# Patient Record
Sex: Female | Born: 1960 | Race: White | Hispanic: No | Marital: Married | Smoking: Never smoker
Health system: Southern US, Community
[De-identification: ages and names within clinical notes are randomized; demographics above are authoritative.]

## PROBLEM LIST (undated history)

## (undated) DIAGNOSIS — J45909 Unspecified asthma, uncomplicated: Secondary | ICD-10-CM

## (undated) HISTORY — PX: TONSILLECTOMY: SUR1361

---

## 1998-12-04 ENCOUNTER — Other Ambulatory Visit: Admission: RE | Admit: 1998-12-04 | Discharge: 1998-12-04 | Payer: Self-pay | Admitting: Obstetrics

## 2000-01-07 ENCOUNTER — Other Ambulatory Visit: Admission: RE | Admit: 2000-01-07 | Discharge: 2000-01-07 | Payer: Self-pay | Admitting: *Deleted

## 2000-05-28 ENCOUNTER — Encounter: Payer: Self-pay | Admitting: Family Medicine

## 2000-05-28 ENCOUNTER — Ambulatory Visit (HOSPITAL_COMMUNITY): Admission: RE | Admit: 2000-05-28 | Discharge: 2000-05-28 | Payer: Self-pay | Admitting: Family Medicine

## 2001-10-12 ENCOUNTER — Other Ambulatory Visit: Admission: RE | Admit: 2001-10-12 | Discharge: 2001-10-12 | Payer: Self-pay | Admitting: Obstetrics and Gynecology

## 2001-10-25 ENCOUNTER — Encounter: Admission: RE | Admit: 2001-10-25 | Discharge: 2001-10-25 | Payer: Self-pay | Admitting: Obstetrics and Gynecology

## 2001-10-25 ENCOUNTER — Encounter: Payer: Self-pay | Admitting: Obstetrics and Gynecology

## 2002-11-08 ENCOUNTER — Other Ambulatory Visit: Admission: RE | Admit: 2002-11-08 | Discharge: 2002-11-08 | Payer: Self-pay | Admitting: Obstetrics and Gynecology

## 2004-08-12 ENCOUNTER — Emergency Department (HOSPITAL_COMMUNITY): Admission: AD | Admit: 2004-08-12 | Discharge: 2004-08-12 | Payer: Self-pay | Admitting: Family Medicine

## 2004-08-14 ENCOUNTER — Emergency Department (HOSPITAL_COMMUNITY): Admission: EM | Admit: 2004-08-14 | Discharge: 2004-08-14 | Payer: Self-pay | Admitting: Family Medicine

## 2005-07-11 ENCOUNTER — Encounter: Admission: RE | Admit: 2005-07-11 | Discharge: 2005-07-11 | Payer: Self-pay | Admitting: Obstetrics and Gynecology

## 2006-11-12 ENCOUNTER — Ambulatory Visit: Payer: Self-pay | Admitting: Psychology

## 2006-11-27 ENCOUNTER — Ambulatory Visit: Payer: Self-pay | Admitting: Psychology

## 2006-12-09 ENCOUNTER — Ambulatory Visit: Payer: Self-pay | Admitting: Psychology

## 2006-12-18 ENCOUNTER — Ambulatory Visit: Payer: Self-pay | Admitting: Psychology

## 2007-01-22 ENCOUNTER — Ambulatory Visit: Payer: Self-pay | Admitting: Psychology

## 2007-02-05 ENCOUNTER — Ambulatory Visit: Payer: Self-pay | Admitting: Psychology

## 2007-02-19 ENCOUNTER — Ambulatory Visit: Payer: Self-pay | Admitting: Psychology

## 2007-03-05 ENCOUNTER — Ambulatory Visit: Payer: Self-pay | Admitting: Psychology

## 2010-12-20 ENCOUNTER — Other Ambulatory Visit: Payer: Self-pay | Admitting: Obstetrics and Gynecology

## 2010-12-20 DIAGNOSIS — Z1231 Encounter for screening mammogram for malignant neoplasm of breast: Secondary | ICD-10-CM

## 2011-01-01 ENCOUNTER — Ambulatory Visit
Admission: RE | Admit: 2011-01-01 | Discharge: 2011-01-01 | Disposition: A | Discharge status: Home / Self Care | Payer: Commercial Managed Care - PPO | Source: Ambulatory Visit | Attending: Obstetrics and Gynecology | Admitting: Obstetrics and Gynecology

## 2011-01-01 DIAGNOSIS — Z1231 Encounter for screening mammogram for malignant neoplasm of breast: Secondary | ICD-10-CM

## 2011-01-02 ENCOUNTER — Other Ambulatory Visit: Payer: Self-pay | Admitting: Obstetrics and Gynecology

## 2011-01-02 DIAGNOSIS — R928 Other abnormal and inconclusive findings on diagnostic imaging of breast: Secondary | ICD-10-CM

## 2011-01-09 ENCOUNTER — Ambulatory Visit
Admission: RE | Admit: 2011-01-09 | Discharge: 2011-01-09 | Disposition: A | Discharge status: Home / Self Care | Payer: Commercial Managed Care - PPO | Source: Ambulatory Visit | Attending: Obstetrics and Gynecology | Admitting: Obstetrics and Gynecology

## 2011-01-09 ENCOUNTER — Other Ambulatory Visit: Payer: Self-pay | Admitting: Obstetrics and Gynecology

## 2011-01-09 DIAGNOSIS — R928 Other abnormal and inconclusive findings on diagnostic imaging of breast: Secondary | ICD-10-CM

## 2011-05-27 ENCOUNTER — Ambulatory Visit (INDEPENDENT_AMBULATORY_CARE_PROVIDER_SITE_OTHER): Payer: Commercial Managed Care - PPO | Admitting: Family Medicine

## 2011-05-27 ENCOUNTER — Ambulatory Visit: Payer: Commercial Managed Care - PPO

## 2011-05-27 DIAGNOSIS — R059 Cough, unspecified: Secondary | ICD-10-CM

## 2011-05-27 DIAGNOSIS — R5383 Other fatigue: Secondary | ICD-10-CM

## 2011-05-27 DIAGNOSIS — R002 Palpitations: Secondary | ICD-10-CM

## 2011-05-27 DIAGNOSIS — R5381 Other malaise: Secondary | ICD-10-CM

## 2011-05-27 DIAGNOSIS — R0989 Other specified symptoms and signs involving the circulatory and respiratory systems: Secondary | ICD-10-CM

## 2011-05-27 DIAGNOSIS — R05 Cough: Secondary | ICD-10-CM

## 2011-05-27 DIAGNOSIS — R0600 Dyspnea, unspecified: Secondary | ICD-10-CM

## 2011-05-27 LAB — POCT CBC
HCT, POC: 41 % (ref 37.7–47.9)
Hemoglobin: 13.3 g/dL (ref 12.2–16.2)
Lymph, poc: 2.2 (ref 0.6–3.4)
MCHC: 32.4 g/dL (ref 31.8–35.4)
POC Granulocyte: 4.3 (ref 2–6.9)

## 2011-05-27 LAB — BASIC METABOLIC PANEL
CO2: 26 mEq/L (ref 19–32)
Calcium: 9.4 mg/dL (ref 8.4–10.5)
Glucose, Bld: 95 mg/dL (ref 70–99)
Potassium: 4.3 mEq/L (ref 3.5–5.3)
Sodium: 139 mEq/L (ref 135–145)

## 2011-05-27 MED ORDER — RANITIDINE HCL 150 MG PO TABS: 150.0000 mg | ORAL_TABLET | Freq: Two times a day (BID) | ORAL | Status: DC

## 2011-05-27 MED ORDER — CETIRIZINE HCL 10 MG PO TABS: 10.0000 mg | ORAL_TABLET | Freq: Every day | ORAL | Status: DC

## 2011-05-27 NOTE — Patient Instructions (Signed)
Your cough and chest symptoms may be due to allergies or reflux of stomach acid.  Start zantac twice per day, zyrtec once per day.  Avoid caffeine, spicy foods, and peppermint.  We will also refer you to a cardiologist to evaluate your symptoms, but if any new or worsening chest symptoms, or other new or worsening symptoms, return to the clinic, emergency room, or call 911 if you feel like it is an emergency.  Your electrolyte test results should be available in the next 7-10 days.

## 2011-05-27 NOTE — Progress Notes (Signed)
Subjective:    Patient ID: Sylvia Avila, female    DOB: [birth year], 51 y.o.   MRN: [id number]  HPI Sylvia Avila is a 51 y.o. female Doesn't feel well, on and off for few weeks.  Thought d/t not taking vitamins.  Noticed with running a few weeks ago. Fatigued.  Trouble taking full breath as triggers cough.  Tickle feeling in throat. Few months.Tried claritin for allergies x1 - no relief.  Lightheaded a times.  Felt weeks a few weeks ago.   No chest pain, but has had palpitations with lying down occasionally.  No SOB, no calf pain/swelling.  Last long car ride in December.  Started swimming in January, started running and up to 3 miles.  Not sore, but bones ache.  Tx: claritin x1, otc ibuprofen.  LMP - currently - started few days ago - lighter.    FH: no known CAD/MI  Review of Systems  Constitutional: Negative for fever and chills.  Respiratory: Positive for cough. Negative for chest tightness and shortness of breath.        Trouble taking deep breath.  Cardiovascular:       "Bruised", pressure sensation in chest, with L shoulder ache for 3 days- more sore to move.  No jaw/face radiation..  Gastrointestinal: Negative for nausea, vomiting and blood in stool.       Uneasy feeling in stomach.  Genitourinary: Negative for difficulty urinating.  Musculoskeletal: Positive for arthralgias.  Neurological: Positive for dizziness. Negative for headaches.       Episodic dizziness at times.  Just has to stop for a second.  Psychiatric/Behavioral: Positive for agitation. Negative for suicidal ideas, hallucinations and dysphoric mood.       Objective:   Physical Exam  Constitutional: She is oriented to person, place, and time. She appears well-developed and well-nourished.  HENT:  Head: Normocephalic and atraumatic.  Right Ear: External ear normal.  Left Ear: External ear normal.  Nose: No mucosal edema or rhinorrhea.  Mouth/Throat: Oropharynx is clear and moist.  Eyes:  Conjunctivae and EOM are normal. Pupils are equal, round, and reactive to light.  Neck: Neck supple. No thyromegaly present.  Cardiovascular: Normal rate, regular rhythm, normal heart sounds and intact distal pulses.  Exam reveals no friction rub.   No murmur heard. Pulmonary/Chest: Effort normal and breath sounds normal. No respiratory distress. She has no wheezes. She has no rales. She exhibits no tenderness.  Abdominal: Soft. Bowel sounds are normal. She exhibits no distension. There is no tenderness. There is no rebound and no guarding.  Musculoskeletal: She exhibits no edema and no tenderness.       Calves NT Homan's negative.  Lymphadenopathy:    She has no cervical adenopathy.  Neurological: She is alert and oriented to person, place, and time.  Skin: Skin is warm and dry. No rash noted.  Psychiatric: She has a normal mood and affect. Her behavior is normal.   EKG: SR, slow, early repol, no acute findings otherwise.  [facility] reading (PRIMARY) by  Dr. Neva Seat: CXR:  NAD  Results for orders placed in visit on 05/27/11  POCT CBC      Component Value Range   WBC 7.1  4.6 - 10.2 (K/uL)   Lymph, poc 2.2  0.6 - 3.4    POC LYMPH PERCENT 31.4  10 - 50 (%L)   MID (cbc) 0.6  0 - 0.9    POC MID % 7.8  0 - 12 (%M)   POC Granulocyte  4.3  2 - 6.9    Granulocyte percent 60.8  37 - 80 (%G)   RBC 4.75  4.04 - 5.48 (M/uL)   Hemoglobin 13.3  12.2 - 16.2 (g/dL)   HCT, POC 95.6  21.3 - 47.9 (%)   MCV 86.3  80 - 97 (fL)   MCH, POC 28.0  27 - 31.2 (pg)   MCHC 32.4  31.8 - 35.4 (g/dL)   RDW, POC 08.6     Platelet Count, POC 251  142 - 424 (K/uL)   MPV 9.6  0 - 99.8 (fL)       Assessment & Plan:  Sylvia Avila is a 51 y.o. female 1. Dyspnea  EKG 12-Lead, POCT CBC  2. Cough  EKG 12-Lead, DG Chest 2 View  3. Chest pain  EKG 12-Lead, DG Chest 2 View  4. Malaise  POCT CBC, Basic metabolic panel   Cough with tickle sensation in throat, and cough with deep breath - suspect all rhinitis with PND  vs laryngopharyngeal reflux with chest sensation. Trial of Zantac 150mg  BID, Zyrtec 10mg  qd, and recheck in next 1 week.    Generalized malaise and episodic dizziness, episodic palpitations.  Reassuring CXR and EKG, but with borderline hypotension, and L arm symptoms, will refer to cardiology for eval.Check BMP, TSH.  Increase fluid intake,  Avoid caffeine, peppermint, and spicy foods, and avoid exertion/exercise until seen by cardiology. ER/911 precautions reviewed.

## 2011-05-30 ENCOUNTER — Telehealth: Payer: Self-pay

## 2011-05-30 NOTE — Telephone Encounter (Signed)
Spoke with patient, she just received a call from the referral office with appointment.

## 2011-05-30 NOTE — Telephone Encounter (Signed)
Hasn't heard anything back from Dr. Neva Seat about referral to cardiologist.

## 2011-06-04 ENCOUNTER — Encounter: Payer: Self-pay | Admitting: Cardiovascular Disease

## 2011-06-04 ENCOUNTER — Ambulatory Visit (INDEPENDENT_AMBULATORY_CARE_PROVIDER_SITE_OTHER): Payer: Commercial Managed Care - PPO | Admitting: Cardiovascular Disease

## 2011-06-04 VITALS — BP 130/80 | HR 55 | Ht 64.0 in | Wt 173.0 lb

## 2011-06-04 DIAGNOSIS — R079 Chest pain, unspecified: Secondary | ICD-10-CM

## 2011-06-04 NOTE — Patient Instructions (Signed)
Your physician recommends that you schedule a follow-up appointment as needed.   Your physician recommends that you continue on your current medications as directed. Please refer to the Current Medication list given to you today.  

## 2011-06-04 NOTE — Assessment & Plan Note (Signed)
The patient has atypical chest pain. There is a paucity of cardiac risk factors and her symptoms are highly atypical. Her EKG is normal. I don't think she needs any further workup at this point. We reviewed her cardiac symptoms today, and if she develops any change in her current symptoms she will call back for followup, otherwise I would be happy to see her as needed.

## 2011-06-04 NOTE — Progress Notes (Signed)
HPI:  This is a 51 year old woman who presents for evaluation of chest pain. The patient has had an upper respiratory infection for the last 6-8 weeks. She feels like there is a "belt around my chest." This is a constant feeling never goes away. She feels restricted when she takes a deep breath. She denies substernal chest pain or pressure. The patient has lost her voice and she feels generally tired. She denies fevers, chills, orthopnea, PND, or edema. She does admit to some palpitations. She denies lightheadedness or presyncope. She has no past history of cardiac problems. She has felt well enough in the last week to exercise a few times and she has had no symptoms with exertion.  The patient has no family history of coronary artery disease. She has no history of hypertension, dyslipidemia, or diabetes. She has never been a smoker.  Outpatient Encounter Prescriptions as of 06/04/2011  Medication Sig Dispense Refill  . Cholecalciferol (VITAMIN D-3 PO) Take 1 tablet by mouth daily.      . Misc Natural Products (OSTEO BI-FLEX JOINT SHIELD PO) Take 1 tablet by mouth daily.      . Multiple Vitamin (MULTIVITAMIN) capsule Take 1 capsule by mouth daily.      . Omega-3 Fatty Acids (FISH OIL PO) Take 1 tablet by mouth daily.      Marland Kitchen DISCONTD: cetirizine (ZYRTEC) 10 MG tablet Take 1 tablet (10 mg total) by mouth daily.  30 tablet  0  . DISCONTD: ranitidine (ZANTAC) 150 MG tablet Take 1 tablet (150 mg total) by mouth 2 (two) times daily.  60 tablet  0    Penicillins and Other  No past medical history on file.  Past Surgical History  Procedure Date  . Tonsillectomy     History   Social History  . Marital Status: Married    Spouse Name: N/A    Number of Children: N/A  . Years of Education: N/A   Occupational History  . Not on file.   Social History Main Topics  . Smoking status: Never Smoker   . Smokeless tobacco: Not on file  . Alcohol Use: Yes  . Drug Use: Not on file  . Sexually  Active: Not on file   Other Topics Concern  . Not on file   Social History Narrative  . No narrative on file    Family history: No coronary artery disease  ROS: General: no fevers/chills/night sweats. Positive for generalized fatigue Eyes: no blurry vision, diplopia, or amaurosis ENT: no sore throat or hearing loss. Otherwise see history of present illness Resp: See history of present illness CV: no edema or palpitations GI: no abdominal pain, nausea, vomiting, diarrhea, or constipation GU: no dysuria, frequency, or hematuria Skin: no rash Neuro: no headache, numbness, tingling, or weakness of extremities Musculoskeletal: no joint pain or swelling Heme: no bleeding, DVT, or easy bruising Endo: no polydipsia or polyuria  BP 130/80  Pulse 55  Ht 5\' 4"  (1.626 m)  Wt 78.472 kg (173 lb)  BMI 29.70 kg/m2  LMP 05/27/2011  PHYSICAL EXAM: Pt is alert and oriented, WD, WN, in no distress. HEENT: normal, the patient's voice is hoarse Neck: JVP normal. Carotid upstrokes normal without bruits. No thyromegaly. Lungs: equal expansion, clear bilaterally CV: Apex is discrete and nondisplaced, RRR without murmur or gallop Abd: soft, NT, +BS, no bruit, no hepatosplenomegaly Back: no CVA tenderness Ext: no C/C/E        Femoral pulses 2+= without bruits  DP/PT pulses intact and = Skin: warm and dry without rash Neuro: CNII-XII intact             Strength intact = bilaterally  EKG:  Sinus bradycardia 55 beats per minute, otherwise within normal limits.  ASSESSMENT AND PLAN:

## 2011-06-09 NOTE — Progress Notes (Signed)
Addended by: Judithe Modest D on: 06/09/2011 04:41 PM   Modules accepted: Orders

## 2011-12-01 ENCOUNTER — Other Ambulatory Visit: Payer: Self-pay | Admitting: Obstetrics and Gynecology

## 2011-12-01 DIAGNOSIS — N63 Unspecified lump in unspecified breast: Secondary | ICD-10-CM

## 2012-01-02 ENCOUNTER — Ambulatory Visit
Admission: RE | Admit: 2012-01-02 | Discharge: 2012-01-02 | Disposition: A | Discharge status: Home / Self Care | Payer: Commercial Managed Care - PPO | Source: Ambulatory Visit | Attending: Obstetrics and Gynecology | Admitting: Obstetrics and Gynecology

## 2012-01-02 ENCOUNTER — Other Ambulatory Visit: Payer: Self-pay | Admitting: Obstetrics and Gynecology

## 2012-01-02 DIAGNOSIS — N63 Unspecified lump in unspecified breast: Secondary | ICD-10-CM

## 2013-04-04 ENCOUNTER — Other Ambulatory Visit: Payer: Self-pay

## 2013-04-04 DIAGNOSIS — Z1231 Encounter for screening mammogram for malignant neoplasm of breast: Secondary | ICD-10-CM

## 2013-04-19 ENCOUNTER — Ambulatory Visit: Payer: Commercial Managed Care - PPO

## 2013-05-04 ENCOUNTER — Ambulatory Visit: Payer: Commercial Managed Care - PPO

## 2014-04-18 ENCOUNTER — Other Ambulatory Visit: Payer: Self-pay

## 2014-04-18 DIAGNOSIS — Z1231 Encounter for screening mammogram for malignant neoplasm of breast: Secondary | ICD-10-CM

## 2014-04-21 ENCOUNTER — Encounter (INDEPENDENT_AMBULATORY_CARE_PROVIDER_SITE_OTHER): Payer: Self-pay

## 2014-04-21 ENCOUNTER — Ambulatory Visit
Admission: RE | Admit: 2014-04-21 | Discharge: 2014-04-21 | Disposition: A | Discharge status: Home / Self Care | Payer: Commercial Managed Care - PPO | Source: Ambulatory Visit

## 2014-04-21 DIAGNOSIS — Z1231 Encounter for screening mammogram for malignant neoplasm of breast: Secondary | ICD-10-CM

## 2014-11-15 ENCOUNTER — Emergency Department (HOSPITAL_COMMUNITY)
Admission: EM | Admit: 2014-11-15 | Discharge: 2014-11-16 | Disposition: A | Discharge status: Home / Self Care | Payer: BLUE CROSS/BLUE SHIELD | Attending: Emergency Medicine | Admitting: Emergency Medicine

## 2014-11-15 ENCOUNTER — Emergency Department (HOSPITAL_COMMUNITY): Payer: BLUE CROSS/BLUE SHIELD

## 2014-11-15 ENCOUNTER — Encounter (HOSPITAL_COMMUNITY): Payer: Self-pay | Admitting: Emergency Medicine

## 2014-11-15 DIAGNOSIS — Z79899 Other long term (current) drug therapy: Secondary | ICD-10-CM | POA: Diagnosis not present

## 2014-11-15 DIAGNOSIS — R1013 Epigastric pain: Secondary | ICD-10-CM

## 2014-11-15 DIAGNOSIS — Z880 Allergy status to penicillin: Secondary | ICD-10-CM | POA: Insufficient documentation

## 2014-11-15 DIAGNOSIS — K529 Noninfective gastroenteritis and colitis, unspecified: Secondary | ICD-10-CM | POA: Insufficient documentation

## 2014-11-15 LAB — LIPASE, BLOOD: LIPASE: 16 U/L — AB (ref 22–51)

## 2014-11-15 LAB — COMPREHENSIVE METABOLIC PANEL
ALBUMIN: 3.9 g/dL (ref 3.5–5.0)
ALT: 14 U/L (ref 14–54)
AST: 17 U/L (ref 15–41)
Alkaline Phosphatase: 109 U/L (ref 38–126)
Anion gap: 8 (ref 5–15)
BILIRUBIN TOTAL: 0.7 mg/dL (ref 0.3–1.2)
BUN: 14 mg/dL (ref 6–20)
CHLORIDE: 103 mmol/L (ref 101–111)
CO2: 27 mmol/L (ref 22–32)
CREATININE: 0.84 mg/dL (ref 0.44–1.00)
Calcium: 8.9 mg/dL (ref 8.9–10.3)
GFR calc Af Amer: >60 mL/min (ref >60)
GLUCOSE: 110 mg/dL — AB (ref 65–99)
POTASSIUM: 3.8 mmol/L (ref 3.5–5.1)
Sodium: 138 mmol/L (ref 135–145)
Total Protein: 7.5 g/dL (ref 6.5–8.1)

## 2014-11-15 LAB — CBC
HEMATOCRIT: 37.8 % (ref 36.0–46.0)
Hemoglobin: 13 g/dL (ref 12.0–15.0)
MCH: 28.1 pg (ref 26.0–34.0)
MCHC: 34.4 g/dL (ref 30.0–36.0)
MCV: 81.6 fL (ref 78.0–100.0)
PLATELETS: 230 10*3/uL (ref 150–400)
RBC: 4.63 MIL/uL (ref 3.87–5.11)
RDW: 13.7 % (ref 11.5–15.5)
WBC: 13.3 10*3/uL — AB (ref 4.0–10.5)

## 2014-11-15 LAB — I-STAT TROPONIN, ED: Troponin i, poc: 0 ng/mL (ref 0.00–0.08)

## 2014-11-15 LAB — URINALYSIS, ROUTINE W REFLEX MICROSCOPIC
GLUCOSE, UA: NEGATIVE mg/dL
HGB URINE DIPSTICK: NEGATIVE
KETONES UR: 15 mg/dL — AB
Leukocytes, UA: NEGATIVE
NITRITE: NEGATIVE
PH: 5.5 (ref 5.0–8.0)
Protein, ur: NEGATIVE mg/dL
SPECIFIC GRAVITY, URINE: 1.031 — AB (ref 1.005–1.030)
Urobilinogen, UA: 1 mg/dL (ref 0.0–1.0)

## 2014-11-15 MED ORDER — MORPHINE SULFATE (PF) 4 MG/ML IV SOLN
4.0000 mg | Freq: Once | INTRAVENOUS | Status: AC
  Administered 2014-11-15: 4 mg via INTRAVENOUS
  Filled 2014-11-15: qty 1

## 2014-11-15 MED ORDER — IOHEXOL 300 MG/ML  SOLN
100.0000 mL | Freq: Once | INTRAMUSCULAR | Status: AC | PRN
  Administered 2014-11-15: 100 mL via INTRAVENOUS

## 2014-11-15 MED ORDER — SODIUM CHLORIDE 0.9 % IV BOLUS (SEPSIS)
1000.0000 mL | Freq: Once | INTRAVENOUS | Status: AC
  Administered 2014-11-15: 1000 mL via INTRAVENOUS

## 2014-11-15 MED ORDER — PANTOPRAZOLE SODIUM 40 MG IV SOLR
40.0000 mg | Freq: Once | INTRAVENOUS | Status: AC
  Administered 2014-11-15: 40 mg via INTRAVENOUS
  Filled 2014-11-15: qty 40

## 2014-11-15 NOTE — ED Notes (Signed)
Per pt, states abdominal pain for a few days-went to UC and sent here for gallbladder wokup

## 2014-11-15 NOTE — ED Provider Notes (Signed)
CSN: [id number]     Arrival date & time 11/15/14  1804 History   First MD Initiated Contact with Patient 11/15/14 2031     Chief Complaint  Patient presents with  . Abdominal Pain     (Consider location/radiation/quality/duration/timing/severity/associated sxs/prior Treatment) Patient is a 54 y.o. female presenting with abdominal pain. The history is provided by the patient, the spouse and a relative. No language interpreter was used.  Abdominal Pain Sylvia Avila is a 54 y.o female who presents from urgent care for right upper quadrant abdominal pain that began yesterday at 4pm after eating lobster, crabs, french fries.  She was out of town and has been eating different foods.  She came back today and went to straight to urgent care and was sent from there to the hospital. The pain was intermittent and jabbing but became constant and throbbing today. She denies any treatment prior to arrival.  Nothing makes her symptoms better or worse. Last BM an hour ago. She has 3 episodes of diarrhea in the past couple of days. She denies any blood in the stool. She reports subjective fever and chills. She denies any chest pain, shortness of breath, nausea, vomiting, diarrhea, or urinary symptoms.   History reviewed. No pertinent past medical history. Past Surgical History  Procedure Laterality Date  . Tonsillectomy     Family History  Problem Relation Age of Onset  . Coronary artery disease       None in the family   Social History  Substance Use Topics  . Smoking status: Never Smoker   . Smokeless tobacco: None  . Alcohol Use: Yes   OB History    No data available     Review of Systems  Gastrointestinal: Positive for abdominal pain.  All other systems reviewed and are negative.     Allergies  Penicillins and Other  Home Medications   Prior to Admission medications   Medication Sig Start Date End Date Taking? Authorizing Provider  albuterol (PROVENTIL HFA;VENTOLIN HFA) 108 (90  BASE) MCG/ACT inhaler Inhale 2 puffs into the lungs every 6 (six) hours as needed for wheezing or shortness of breath.   Yes Historical Provider, MD  Cholecalciferol (VITAMIN D-3 PO) Take 1 tablet by mouth daily.   Yes Historical Provider, MD  Misc Natural Products (OSTEO BI-FLEX JOINT SHIELD PO) Take 1 tablet by mouth daily.   Yes Historical Provider, MD  Multiple Vitamin (MULTIVITAMIN) capsule Take 1 capsule by mouth daily.   Yes Historical Provider, MD  Omega-3 Fatty Acids (FISH OIL PO) Take 1 tablet by mouth daily.   Yes Historical Provider, MD  HYDROcodone-acetaminophen (NORCO/VICODIN) 5-325 MG per tablet Take 2 tablets by mouth every 4 (four) hours as needed. 11/16/14   Yoshua Geisinger Patel-Mills, PA-C  pantoprazole (PROTONIX) 20 MG tablet Take 1 tablet (20 mg total) by mouth daily. 11/16/14   Hayla Hinger Patel-Mills, PA-C   BP 110/51 mmHg  Pulse 77  Temp(Src) 99.3 F (37.4 C) (Oral)  Resp 16  SpO2 100%  LMP 05/27/2011 Physical Exam  Constitutional: She is oriented to person, place, and time. She appears well-developed and well-nourished.  HENT:  Head: Normocephalic and atraumatic.  Eyes: Conjunctivae are normal.  Neck: Normal range of motion. Neck supple.  Cardiovascular: Normal rate, regular rhythm and normal heart sounds.   Pulmonary/Chest: Effort normal and breath sounds normal.  Abdominal: Soft. Normal appearance. She exhibits no distension. There is tenderness in the right upper quadrant. There is guarding and positive Murphy's sign. There is no  rigidity and no rebound.    Musculoskeletal: Normal range of motion.  Neurological: She is alert and oriented to person, place, and time.  Skin: Skin is warm and dry.  Psychiatric: She has a normal mood and affect. Her behavior is normal.  Nursing note and vitals reviewed.   ED Course  Procedures (including critical care time) Labs Review Labs Reviewed  LIPASE, BLOOD - Abnormal; Notable for the following:    Lipase 16 (*)    All other  components within normal limits  COMPREHENSIVE METABOLIC PANEL - Abnormal; Notable for the following:    Glucose, Bld 110 (*)    All other components within normal limits  CBC - Abnormal; Notable for the following:    WBC 13.3 (*)    All other components within normal limits  URINALYSIS, ROUTINE W REFLEX MICROSCOPIC (NOT AT [facility]) - Abnormal; Notable for the following:    Specific Gravity, Urine 1.031 (*)    Bilirubin Urine SMALL (*)    Ketones, ur 15 (*)    All other components within normal limits  I-STAT TROPOININ, ED    Imaging Review Ct Abdomen Pelvis W Contrast  11/15/2014   CLINICAL DATA:  Right upper quadrant pain for 2 days.  EXAM: CT ABDOMEN AND PELVIS WITH CONTRAST  TECHNIQUE: Multidetector CT imaging of the abdomen and pelvis was performed using the standard protocol following bolus administration of intravenous contrast.  CONTRAST:  OMNIPAQUE IOHEXOL 300 MG/ML  SOLN  COMPARISON:  Ultrasound 11/15/2014  FINDINGS: Lower chest:  No significant abnormality  Hepatobiliary: There are normal appearances of the liver, gallbladder and bile ducts.  Pancreas: Normal  Spleen: Normal  Adrenals/Urinary Tract: The adrenals and kidneys are normal in appearance. There is no urinary calculus evident. There is no hydronephrosis or ureteral dilatation. Collecting systems and ureters appear unremarkable.  Stomach/Bowel: The stomach is normal except for a small hiatal hernia. There is mild small bowel mural edema and minimal mesenteric stranding opacity which may represent enteritis. There is no bowel obstruction. There is no focal inflammatory change. There is no extraluminal air. There is a small volume ascites. The colon appears normal.  Vascular/Lymphatic: The abdominal aorta is normal in caliber. There is no atherosclerotic calcification. There is no adenopathy in the abdomen or pelvis.  Reproductive: The uterus and ovaries appear unremarkable.  Other: Small fat containing umbilical hernia   Musculoskeletal: No significant abnormality  IMPRESSION: 1. There is mild small bowel mural edema and minimal mesenteric stranding opacity. This may represent enteritis. There is no bowel obstruction or perforation. Small volume ascites. 2. Small fat containing umbilical hernia.  Small hiatal hernia.   Electronically Signed   By: Ellery Plunk M.D.   On: 11/15/2014 23:56   US Abdomen Limited  11/15/2014   CLINICAL DATA:  Acute onset of right upper quadrant abdominal pain. Initial encounter.  EXAM: US ABDOMEN LIMITED - RIGHT UPPER QUADRANT  COMPARISON:  None.  FINDINGS: Gallbladder:  No gallstones or wall thickening visualized. No sonographic Murphy sign noted.  Common bile duct:  Diameter: 0.4 cm, within normal limits in caliber.  Liver:  No focal lesion identified. Within normal limits in parenchymal echogenicity.  IMPRESSION: Unremarkable ultrasound of the right upper quadrant.   Electronically Signed   By: Roanna Raider M.D.   On: 11/15/2014 21:44   I have personally reviewed and evaluated these images and lab results as part of my medical decision-making.  EKG interpretation 11/15/14 10:13pm Vent. rate 74 BPM PR interval 156 ms  QRS duration 93 ms QT/QTc 388/430 ms P-R-T axes 60 77 58 Normal Sinus rhythm I have personally reviewed the EKG tracing and agree with the computerized printout as noted, Catha Gosselin, PA-C.  MDM   Final diagnoses:  Enteritis  Epigastric pain  Patient presents for RUQ abdominal pain x 2 days. She is in pain but otherwise well appearing. Her vitals are stable. Her bili and LFT's are not concerning and US of the gallbladder shows no cholelithiasis or duct stone.  Recheck: Feeling only slightly better after pain meds. CT abdomen was ordered which showed possible enteritis.  Medications  morphine 4 MG/ML injection 4 mg (4 mg Intravenous Given 11/15/14 2050)  sodium chloride 0.9 % bolus 1,000 mL (0 mLs Intravenous Stopped 11/16/14 0033)  pantoprazole  (PROTONIX) injection 40 mg (40 mg Intravenous Given 11/15/14 2215)  iohexol (OMNIPAQUE) 300 MG/ML solution 100 mL (100 mLs Intravenous Contrast Given 11/15/14 2318)   I did not put the patient on antibiotics and told her that she needed mainly supportive care since she did not have any N/V/ bloody stools. She also stated that she doesn't do well with most antibiotics and that it aggravates her stomach. I gave her protonix and told her to take tylenol. I explained that she should eat a bland diet and drink plenty of fluids. I discussed strict return precautions such as increased abdominal pain, N/V, or bloody stools.  I gave her follow up with GI and she verbally agrees with the plan.      Catha Gosselin, PA-C 11/16/14 1405  Mancel Bale, MD 11/16/14 [phone]

## 2014-11-15 NOTE — ED Notes (Signed)
EKG given to EDP,Wentz,MD., for review. 

## 2014-11-15 NOTE — Progress Notes (Signed)
EDCM spoke to patient at bedside.  Patient confirms she has [employer] without a pcp.  [facility] provided patient with list of pcps who accept [employer] insurance within a 10 mile radius of her zip code [address].  Patient thankful for services.  No further EDCM needs at this time.

## 2014-11-15 NOTE — ED Notes (Signed)
Pt. Reports taking azithromycin- course finished on Sunday.

## 2014-11-16 MED ORDER — PANTOPRAZOLE SODIUM 20 MG PO TBEC: 20.0000 mg | DELAYED_RELEASE_TABLET | Freq: Every day | ORAL | Status: DC

## 2014-11-16 MED ORDER — HYDROCODONE-ACETAMINOPHEN 5-325 MG PO TABS: 2.0000 | ORAL_TABLET | ORAL | Status: DC | PRN

## 2014-11-16 NOTE — Discharge Instructions (Signed)
Gastritis, Adult Return for fever, increased abdominal pain, vomiting, or bloody stool. Gastritis is soreness and puffiness (inflammation) of the lining of the stomach. If you do not get help, gastritis can cause bleeding and sores (ulcers) in the stomach. HOME CARE   Only take medicine as told by your doctor.  If you were given antibiotic medicines, take them as told. Finish the medicines even if you start to feel better.  Drink enough fluids to keep your pee (urine) clear or pale yellow.  Avoid foods and drinks that make your problems worse. Foods you may want to avoid include:  Caffeine or alcohol.  Chocolate.  Mint.  Garlic and onions.  Spicy foods.  Citrus fruits, including oranges, lemons, or limes.  Food containing tomatoes, including sauce, chili, salsa, and pizza.  Fried and fatty foods.  Eat small meals throughout the day instead of large meals. GET HELP RIGHT AWAY IF:   You have black or dark red poop (stools).  You throw up (vomit) blood. It may look like coffee grounds.  You cannot keep fluids down.  Your belly (abdominal) pain gets worse.  You have a fever.  You do not feel better after 1 week.  You have any other questions or concerns. MAKE SURE YOU:   Understand these instructions.  Will watch your condition.  Will get help right away if you are not doing well or get worse. Document Released: 08/20/2007 Document Revised: 05/26/2011 Document Reviewed: 04/16/2011 [facility] Patient Information 2015 [place], [place]. This information is not intended to replace advice given to you by your health care provider. Make sure you discuss any questions you have with your health care provider.

## 2015-11-07 ENCOUNTER — Encounter: Payer: Self-pay | Admitting: Internal Medicine

## 2015-11-12 ENCOUNTER — Encounter: Payer: Self-pay | Admitting: Internal Medicine

## 2015-12-19 ENCOUNTER — Encounter (HOSPITAL_COMMUNITY): Payer: Self-pay | Admitting: Emergency Medicine

## 2015-12-19 ENCOUNTER — Emergency Department (HOSPITAL_COMMUNITY): Payer: BLUE CROSS/BLUE SHIELD

## 2015-12-19 ENCOUNTER — Emergency Department (HOSPITAL_COMMUNITY)
Admission: EM | Admit: 2015-12-19 | Discharge: 2015-12-19 | Disposition: A | Discharge status: Home / Self Care | Payer: BLUE CROSS/BLUE SHIELD | Attending: Emergency Medicine | Admitting: Emergency Medicine

## 2015-12-19 DIAGNOSIS — Z5181 Encounter for therapeutic drug level monitoring: Secondary | ICD-10-CM | POA: Diagnosis not present

## 2015-12-19 DIAGNOSIS — R29810 Facial weakness: Secondary | ICD-10-CM | POA: Diagnosis present

## 2015-12-19 DIAGNOSIS — Z79899 Other long term (current) drug therapy: Secondary | ICD-10-CM | POA: Insufficient documentation

## 2015-12-19 DIAGNOSIS — G510 Bell's palsy: Secondary | ICD-10-CM | POA: Diagnosis not present

## 2015-12-19 DIAGNOSIS — Z7982 Long term (current) use of aspirin: Secondary | ICD-10-CM | POA: Insufficient documentation

## 2015-12-19 DIAGNOSIS — J45909 Unspecified asthma, uncomplicated: Secondary | ICD-10-CM | POA: Insufficient documentation

## 2015-12-19 DIAGNOSIS — I639 Cerebral infarction, unspecified: Secondary | ICD-10-CM

## 2015-12-19 HISTORY — DX: Unspecified asthma, uncomplicated: J45.909

## 2015-12-19 LAB — I-STAT TROPONIN, ED: TROPONIN I, POC: 0 ng/mL (ref 0.00–0.08)

## 2015-12-19 LAB — COMPREHENSIVE METABOLIC PANEL
ALK PHOS: 105 U/L (ref 38–126)
ALT: 16 U/L (ref 14–54)
ANION GAP: 7 (ref 5–15)
AST: 17 U/L (ref 15–41)
Albumin: 4.2 g/dL (ref 3.5–5.0)
BILIRUBIN TOTAL: 0.5 mg/dL (ref 0.3–1.2)
BUN: 13 mg/dL (ref 6–20)
CALCIUM: 9.6 mg/dL (ref 8.9–10.3)
CO2: 26 mmol/L (ref 22–32)
Chloride: 106 mmol/L (ref 101–111)
Creatinine, Ser: 0.86 mg/dL (ref 0.44–1.00)
Glucose, Bld: 98 mg/dL (ref 65–99)
POTASSIUM: 3.9 mmol/L (ref 3.5–5.1)
Sodium: 139 mmol/L (ref 135–145)
TOTAL PROTEIN: 7.3 g/dL (ref 6.5–8.1)

## 2015-12-19 LAB — PROTIME-INR
INR: 0.96
PROTHROMBIN TIME: 12.7 s (ref 11.4–15.2)

## 2015-12-19 LAB — DIFFERENTIAL
Basophils Absolute: 0 10*3/uL (ref 0.0–0.1)
Basophils Relative: 0 %
EOS ABS: 0.1 10*3/uL (ref 0.0–0.7)
EOS PCT: 1 %
LYMPHS ABS: 2.2 10*3/uL (ref 0.7–4.0)
LYMPHS PCT: 26 %
MONO ABS: 0.5 10*3/uL (ref 0.1–1.0)
Monocytes Relative: 6 %
Neutro Abs: 5.8 10*3/uL (ref 1.7–7.7)
Neutrophils Relative %: 67 %

## 2015-12-19 LAB — APTT: aPTT: 29 seconds (ref 24–36)

## 2015-12-19 LAB — CBC
HCT: 39.3 % (ref 36.0–46.0)
HEMOGLOBIN: 13.4 g/dL (ref 12.0–15.0)
MCH: 27.9 pg (ref 26.0–34.0)
MCHC: 34.1 g/dL (ref 30.0–36.0)
MCV: 81.7 fL (ref 78.0–100.0)
PLATELETS: 262 10*3/uL (ref 150–400)
RBC: 4.81 MIL/uL (ref 3.87–5.11)
RDW: 13.4 % (ref 11.5–15.5)
WBC: 8.6 10*3/uL (ref 4.0–10.5)

## 2015-12-19 LAB — I-STAT CHEM 8, ED
BUN: 15 mg/dL (ref 6–20)
CALCIUM ION: 1.22 mmol/L (ref 1.15–1.40)
Chloride: 102 mmol/L (ref 101–111)
Creatinine, Ser: 0.8 mg/dL (ref 0.44–1.00)
Glucose, Bld: 92 mg/dL (ref 65–99)
HEMATOCRIT: 42 % (ref 36.0–46.0)
HEMOGLOBIN: 14.3 g/dL (ref 12.0–15.0)
Potassium: 3.9 mmol/L (ref 3.5–5.1)
SODIUM: 140 mmol/L (ref 135–145)
TCO2: 25 mmol/L (ref 0–100)

## 2015-12-19 MED ORDER — ONDANSETRON HCL 4 MG/2ML IJ SOLN
4.0000 mg | Freq: Once | INTRAMUSCULAR | Status: AC
  Administered 2015-12-19: 4 mg via INTRAVENOUS
  Filled 2015-12-19: qty 2

## 2015-12-19 MED ORDER — ASPIRIN EC 81 MG PO TBEC
81.0000 mg | DELAYED_RELEASE_TABLET | Freq: Every day | ORAL | Status: DC
  Administered 2015-12-19: 81 mg via ORAL
  Filled 2015-12-19: qty 1

## 2015-12-19 MED ORDER — PREDNISONE 20 MG PO TABS: 20.0000 mg | ORAL_TABLET | Freq: Every day | ORAL | 0 refills | Status: DC

## 2015-12-19 MED ORDER — PREDNISONE 20 MG PO TABS
60.0000 mg | ORAL_TABLET | Freq: Once | ORAL | Status: AC
  Administered 2015-12-19: 60 mg via ORAL
  Filled 2015-12-19: qty 3

## 2015-12-19 MED ORDER — HYDROCODONE-ACETAMINOPHEN 5-325 MG PO TABS: 1.0000 | ORAL_TABLET | ORAL | 0 refills | Status: DC | PRN

## 2015-12-19 MED ORDER — MORPHINE SULFATE (PF) 4 MG/ML IV SOLN
4.0000 mg | Freq: Once | INTRAVENOUS | Status: AC
  Administered 2015-12-19: 4 mg via INTRAVENOUS
  Filled 2015-12-19: qty 1

## 2015-12-19 MED ORDER — VALACYCLOVIR HCL 500 MG PO TABS
1000.0000 mg | ORAL_TABLET | Freq: Once | ORAL | Status: AC
  Administered 2015-12-19: 1000 mg via ORAL
  Filled 2015-12-19: qty 2

## 2015-12-19 MED ORDER — VALACYCLOVIR HCL 1 G PO TABS: 1000.0000 mg | ORAL_TABLET | Freq: Three times a day (TID) | ORAL | 0 refills | Status: AC

## 2015-12-19 NOTE — ED Notes (Signed)
Nurse first called pt's boss that seen her today and states he hasn't noticed any changes.  Reports pt called him 1 hour ago and said she was driving to hospital.

## 2015-12-19 NOTE — ED Triage Notes (Signed)
Pt by POV sent for concern of Stroke. LKW by supervisor of patient at 1300. Left side facial droop. Pt remains alert and oriented. States she has been drooling when drinking water. Denies Hx.,

## 2015-12-19 NOTE — ED Notes (Signed)
Activated Code Stroke 

## 2015-12-19 NOTE — Consult Note (Signed)
NEURO HOSPITALIST CONSULT NOTE   Requestig physician: ED MD   Reason for Consult: Code stroke    History obtained from:  Patient    HPI:                                                                                                                                          Sylvia Avila is an 55 y.o. female presenting to the hospital today secondary to noting that she has a right facial weakness and was unable to keep water in her mouth when she is drinking today at approximately 11 the morning. Patient was brought in as a code stroke. Patient had a head CT which initially showed possible artifact versus subacute stroke however her symptoms she states started today at 11:00 in the morning. On exam patient is unable to bury her eyelashes on the right she has a weak closure of her eyelids on the right she has a facial droop on the right and unable furrow her for head on the right.  In addition she has no taste on the right side of her tongue however intact taste on the left side of her tongue. She denies any lower or upper extremity weakness or sensory changes. TPA was not administered secondary to minimal symptoms.  No past medical history on file.  Past Surgical History:  Procedure Laterality Date  . TONSILLECTOMY      Family History  Problem Relation Age of Onset  . Coronary artery disease       None in the family     Social History:  reports that she has never smoked. She does not have any smokeless tobacco history on file. She reports that she drinks alcohol. Her drug history is not on file.  Allergies  Allergen Reactions  . Penicillins Itching  . Other Rash    Anything in the [facility] family makes her swell up and throat closes    MEDICATIONS:                                                                                                                     No current facility-administered medications for this encounter.    Current Outpatient  Prescriptions  Medication Sig Dispense Refill  . albuterol (PROVENTIL HFA;VENTOLIN HFA) 108 (  90 BASE) MCG/ACT inhaler Inhale 2 puffs into the lungs every 6 (six) hours as needed for wheezing or shortness of breath.    . Cholecalciferol (VITAMIN D-3 PO) Take 1 tablet by mouth daily.    Marland Kitchen HYDROcodone-acetaminophen (NORCO/VICODIN) 5-325 MG per tablet Take 2 tablets by mouth every 4 (four) hours as needed. 6 tablet 0  . Misc Natural Products (OSTEO BI-FLEX JOINT SHIELD PO) Take 1 tablet by mouth daily.    . Multiple Vitamin (MULTIVITAMIN) capsule Take 1 capsule by mouth daily.    . Omega-3 Fatty Acids (FISH OIL PO) Take 1 tablet by mouth daily.    . pantoprazole (PROTONIX) 20 MG tablet Take 1 tablet (20 mg total) by mouth daily. 30 tablet 0  \   ROS:                                                                                                                                       History obtained from the patient  General ROS: negative for - chills, fatigue, fever, night sweats, weight gain or weight loss Psychological ROS: negative for - behavioral disorder, hallucinations, memory difficulties, mood swings or suicidal ideation Ophthalmic ROS: negative for - blurry vision, double vision, eye pain or loss of vision ENT ROS: negative for - epistaxis, nasal discharge, oral lesions, sore throat, tinnitus or vertigo Allergy and Immunology ROS: negative for - hives or itchy/watery eyes Hematological and Lymphatic ROS: negative for - bleeding problems, bruising or swollen lymph nodes Endocrine ROS: negative for - galactorrhea, hair pattern changes, polydipsia/polyuria or temperature intolerance Respiratory ROS: negative for - cough, hemoptysis, shortness of breath or wheezing Cardiovascular ROS: negative for - chest pain, dyspnea on exertion, edema or irregular heartbeat Gastrointestinal ROS: negative for - abdominal pain, diarrhea, hematemesis, nausea/vomiting or stool incontinence Genito-Urinary  ROS: negative for - dysuria, hematuria, incontinence or urinary frequency/urgency Musculoskeletal ROS: negative for - joint swelling or muscular weakness Neurological ROS: as noted in HPI Dermatological ROS: negative for rash and skin lesion changes   Last menstrual period 05/27/2011.   Neurologic Examination:                                                                                                      HEENT-  Normocephalic, no lesions, without obvious abnormality.  Normal external eye and conjunctiva.  Normal TM's bilaterally.  Normal auditory canals and external ears. Normal external nose, mucus membranes and septum.  Normal pharynx. Cardiovascular- S1, S2 normal, pulses palpable throughout  Lungs- chest clear, no wheezing, rales, normal symmetric air entry Abdomen- normal findings: bowel sounds normal Extremities- no edema Lymph-no adenopathy palpable Musculoskeletal-no joint tenderness, deformity or swelling Skin-warm and dry, no hyperpigmentation, vitiligo, or suspicious lesions  Neurological Examination Mental Status: Alert, oriented, thought content appropriate.  Speech fluent without evidence of aphasia.  Able to follow 3 step commands without difficulty. Cranial Nerves: II: Visual fields grossly normal, pupils equal, round, reactive to light and accommodation III,IV, VI: ptosis not present, extra-ocular motions intact bilaterally--unable to bury her eyelashes on the right with weak closure of her right eyelids V,VII: smile asymmetric on the right, facial light touch sensation decreased on the right along with no taste on the right anterior aspect of her tongue VIII: hearing normal bilaterally IX,X: uvula rises symmetrically XI: bilateral shoulder shrug XII: midline tongue extension Motor: Right : Upper extremity   5/5    Left:     Upper extremity   5/5  Lower extremity   5/5     Lower extremity   5/5 Tone and bulk:normal tone throughout; no atrophy noted Sensory:  Pinprick and light touch intact throughout, bilaterally Deep Tendon Reflexes: 2+ and symmetric throughout Plantars: Right: downgoing   Left: downgoing Cerebellar: normal finger-to-nose,  and normal heel-to-shin test Gait: Not tested      Lab Results: Basic Metabolic Panel: No results for input(s): NA, K, CL, CO2, GLUCOSE, BUN, CREATININE, CALCIUM, MG, PHOS in the last 168 hours.  Liver Function Tests: No results for input(s): AST, ALT, ALKPHOS, BILITOT, PROT, ALBUMIN in the last 168 hours. No results for input(s): LIPASE, AMYLASE in the last 168 hours. No results for input(s): AMMONIA in the last 168 hours.  CBC: No results for input(s): WBC, NEUTROABS, HGB, HCT, MCV, PLT in the last 168 hours.  Cardiac Enzymes: No results for input(s): CKTOTAL, CKMB, CKMBINDEX, TROPONINI in the last 168 hours.  Lipid Panel: No results for input(s): CHOL, TRIG, HDL, CHOLHDL, VLDL, LDLCALC in the last 168 hours.  CBG: No results for input(s): GLUCAP in the last 168 hours.  Microbiology: No results found for this or any previous visit.  Coagulation Studies: No results for input(s): LABPROT, INR in the last 72 hours.  Imaging: No results found.     Assessment and plan per attending neurologist  Felicie Mornavid Koralee Wedeking PA-C [facility] [phone]  12/19/2015, 3:34 PM   Assessment/Plan:  This is a 55 year old female presenting with right facial decreased sensation right facial droop along with decreased taste on the right aspect of her tongue. Physical exam is very consistent with a Bell's palsy. CT of head initially was read as normal what radiologist is concerned there might be a hypodensity in the right pons. This is well-defined in looks to be subacute if not artifact. At this time would recommend MRI of the brain to rule out any acute infarct however exam is more consistent with a Bell's palsy. If MRI shows acute infarct would admit patient and continue stroke workup.  Stroke  team will follow this patient in the morning. Any questions starting on 12/20/2015 please call [phone]

## 2015-12-19 NOTE — ED Notes (Signed)
Blood drawn in CT clotted. Lab at bedside for redraw.

## 2015-12-19 NOTE — ED Notes (Signed)
Patient transported to MRI 

## 2015-12-19 NOTE — ED Notes (Signed)
Pt back from MRI 

## 2015-12-19 NOTE — ED Provider Notes (Signed)
[facility]-EMERGENCY DEPT Provider Note   CSN: [id number] Arrival date & time: 12/19/15  1510     History   Chief Complaint Chief Complaint  Patient presents with  . Weakness  . Facial Droop    HPI Sylvia Avila is a 55 y.o. female.  Pt presents to the ED with right sided facial droop.  She noticed it around 1300 when she tried to drink out of a straw and the liquid came out of her mouth.  She said that she felt like she'd been to the dentist.  The pt said that she has ringing in her right ear.  The pt recently quit a long-standing job and started a new one 2 days ago, so she's been under a lot of stress.  The pt said that she has no other neurologic sx.  A code stroke was called upon pt's arrival to the ED.      Past Medical History:  Diagnosis Date  . Asthma     Patient Active Problem List   Diagnosis Date Noted  . Chest pain at rest 06/04/2011    Past Surgical History:  Procedure Laterality Date  . CESAREAN SECTION    . TONSILLECTOMY      OB History    No data available       Home Medications    Prior to Admission medications   Medication Sig Start Date End Date Taking? Authorizing Provider  albuterol (PROVENTIL HFA;VENTOLIN HFA) 108 (90 BASE) MCG/ACT inhaler Inhale 2 puffs into the lungs every 6 (six) hours as needed for wheezing or shortness of breath.   Yes Historical Provider, MD  aspirin 325 MG tablet Take 650 mg by mouth every 6 (six) hours as needed for mild pain.   Yes Historical Provider, MD  Cholecalciferol (VITAMIN D-3 PO) Take 1 tablet by mouth daily.   Yes Historical Provider, MD  Misc Natural Products (OSTEO BI-FLEX JOINT SHIELD PO) Take 1 tablet by mouth daily.   Yes Historical Provider, MD  montelukast (SINGULAIR) 10 MG tablet Take 10 mg by mouth daily as needed for allergies. 10/31/15  Yes Historical Provider, MD  Multiple Vitamin (MULTIVITAMIN) capsule Take 1 capsule by mouth daily.   Yes Historical Provider, MD  Omega-3 Fatty Acids (FISH OIL  PO) Take 1 tablet by mouth daily.   Yes Historical Provider, MD  phentermine (ADIPEX-P) 37.5 MG tablet Take 37.5 mg by mouth daily. 11/20/15  Yes Historical Provider, MD  HYDROcodone-acetaminophen (NORCO/VICODIN) 5-325 MG tablet Take 1 tablet by mouth every 4 (four) hours as needed. 12/19/15   Jacalyn Lefevre, MD  pantoprazole (PROTONIX) 20 MG tablet Take 1 tablet (20 mg total) by mouth daily. Patient not taking: Reported on 12/19/2015 11/16/14   Catha Gosselin, PA-C  predniSONE (DELTASONE) 20 MG tablet Take 1 tablet (20 mg total) by mouth daily. Take 3 tablets daily for 7 days 12/19/15   Jacalyn Lefevre, MD  valACYclovir (VALTREX) 1000 MG tablet Take 1 tablet (1,000 mg total) by mouth 3 (three) times daily. 12/19/15   Jacalyn Lefevre, MD    Family History Family History  Problem Relation Age of Onset  . Coronary artery disease       None in the family    Social History Social History  Substance Use Topics  . Smoking status: Never Smoker  . Smokeless tobacco: Never Used  . Alcohol use Yes     Allergies   Codeine; Penicillins; and Other   Review of Systems Review of Systems  HENT: Positive  for tinnitus.   Neurological: Positive for facial asymmetry.  All other systems reviewed and are negative.    Physical Exam Updated Vital Signs LMP 04/28/2014   Physical Exam  Constitutional: She is oriented to person, place, and time. She appears well-developed and well-nourished.  HENT:  Head: Atraumatic.  Right Ear: External ear normal.  Left Ear: External ear normal.  Nose: Nose normal.  Mouth/Throat: Oropharynx is clear and moist.  No lesion in or around right ear auditory canal  Eyes: Conjunctivae and EOM are normal. Pupils are equal, round, and reactive to light.  Neck: Normal range of motion. Neck supple.  Cardiovascular: Normal rate, regular rhythm, normal heart sounds and intact distal pulses.   Pulmonary/Chest: Effort normal and breath sounds normal.  Abdominal: Soft. Bowel  sounds are normal.  Musculoskeletal: Normal range of motion.  Neurological: She is alert and oriented to person, place, and time. A cranial nerve deficit is present.  CN VII deficit.  Skin: Skin is warm.  Psychiatric: She has a normal mood and affect. Her behavior is normal. Judgment and thought content normal.  Nursing note and vitals reviewed.    ED Treatments / Results  Labs (all labs ordered are listed, but only abnormal results are displayed) Labs Reviewed  PROTIME-INR  APTT  CBC  DIFFERENTIAL  COMPREHENSIVE METABOLIC PANEL  I-STAT TROPOININ, ED  I-STAT CHEM 8, ED  CBG MONITORING, ED    EKG  EKG Interpretation  Date/Time:  Wednesday December 19 2015 15:56:27 EDT Ventricular Rate:  68 PR Interval:    QRS Duration: 94 QT Interval:  397 QTC Calculation: 423 R Axis:   55 Text Interpretation:  Sinus rhythm Normal ECG Confirmed by DELO  MD, DOUGLAS ([id number]) on 12/19/2015 4:21:30 PM       Radiology Mr Brain Wo Contrast  Result Date: 12/19/2015 CLINICAL DATA:  55 y/o  F; right facial weakness. EXAM: MRI HEAD WITHOUT CONTRAST TECHNIQUE: Multiplanar, multiecho pulse sequences of the brain and surrounding structures were obtained without intravenous contrast. COMPARISON:  12/19/2015 CT head. FINDINGS: Brain: No acute infarction, hemorrhage, hydrocephalus, extra-axial collection or mass lesion. Few nonspecific foci of T2 FLAIR hyperintensity predominantly in bifrontal subcortical white matter probably represent mild chronic microvascular ischemic changes. No signal abnormality within the pons is identified. Vascular: Normal flow voids. Skull and upper cervical spine: Normal marrow signal. Sinuses/Orbits: Negative. Other: None. IMPRESSION: 1. No evidence for acute stroke, intracranial hemorrhage, or mass effect is identified 2. No signal abnormality within the pons to correlate with questioned hypodense focus on CT. 3. Mild chronic microvascular ischemic changes. Electronically Signed    By: Mitzi Hansen M.D.   On: 12/19/2015 18:12   Ct Head Code Stroke W/o Cm  Result Date: 12/19/2015 CLINICAL DATA:  Code stroke. Right-sided facial numbness. Right ear pain. EXAM: CT HEAD WITHOUT CONTRAST TECHNIQUE: Contiguous axial images were obtained from the base of the skull through the vertex without intravenous contrast. COMPARISON:  None. FINDINGS: Brain: There is no evidence of acute cortical infarct, intracranial hemorrhage, mass, midline shift, or extra-axial fluid collection. The ventricles and sulci are normal. There is a 6 mm low-density focus in the right paramedian pons (series 201, image 7) which is within a larger area of artifact related to imaging of the skullbase. Vascular: No hyperdense vessel or unexpected calcification. Skull: No fracture or focal osseous lesion. Sinuses/Orbits: Unremarkable orbits. Visualized paranasal sinuses and mastoid air cells are clear. Other: None. ASPECTS [facility] Stroke Program Early CT Score) - Ganglionic level infarction (caudate,  lentiform nuclei, internal capsule, insula, M1-M3 cortex): 7 - Supraganglionic infarction (M4-M6 cortex): 3 Total score (0-10 with 10 being normal): 10 IMPRESSION: 1. No evidence of acute cortical infarct or intracranial hemorrhage. 2. ASPECTS is 10. 3. Artifact versus lacunar infarct or other lesion in the right paramedian pons. These results were called by telephone at the time of interpretation on 12/19/2015 at 3:34 pm to Dr. Desmond Lopeurk, who verbally acknowledged these results. Electronically Signed   By: Sebastian AcheAllen  Grady M.D.   On: 12/19/2015 15:44    Procedures Procedures (including critical care time)  Medications Ordered in ED Medications  aspirin EC tablet 81 mg (81 mg Oral Given 12/19/15 1806)  valACYclovir (VALTREX) tablet 1,000 mg (1,000 mg Oral Given 12/19/15 1806)  predniSONE (DELTASONE) tablet 60 mg (60 mg Oral Given 12/19/15 1806)  morphine 4 MG/ML injection 4 mg (4 mg Intravenous Given 12/19/15 1642)    ondansetron (ZOFRAN) injection 4 mg (4 mg Intravenous Given 12/19/15 1642)     Initial Impression / Assessment and Plan / ED Course  I have reviewed the triage vital signs and the nursing notes.  Pertinent labs & imaging results that were available during my care of the patient were reviewed by me and considered in my medical decision making (see chart for details).  Clinical Course    Pt's sx c/w Bells Palsy.  Neuro saw pt and agree, but they want a MRI.  That will be done.  MRI looks good, so pt will be d/c'd.  She knows to return if worse.  Final Clinical Impressions(s) / ED Diagnoses   Final diagnoses:  Bell's palsy    New Prescriptions New Prescriptions   HYDROCODONE-ACETAMINOPHEN (NORCO/VICODIN) 5-325 MG TABLET    Take 1 tablet by mouth every 4 (four) hours as needed.   PREDNISONE (DELTASONE) 20 MG TABLET    Take 1 tablet (20 mg total) by mouth daily. Take 3 tablets daily for 7 days   VALACYCLOVIR (VALTREX) 1000 MG TABLET    Take 1 tablet (1,000 mg total) by mouth 3 (three) times daily.     Jacalyn LefevreJulie Symantha Steeber, MD 12/19/15 Rickey [id number]

## 2015-12-19 NOTE — ED Notes (Addendum)
Pt arrived at triage with R sided facial droop, states when trying to drink something it runs out of the R side of her mouth, and confusion.  States symptoms started around 12pm, then stated 10am, and then stated 1pm.  States she is confused and unsure of when symptoms started.  PT placed in wheelchair and was unable to lift her R foot up onto wheelchair.  EMT lifted pt's foot for her.  Pt started c/o severe nausea once riding in wheelchair.  Dr. Rosalia Hammersay cleared airway and pt taken to CT by nurse first and Merry ProudBrandi, RN.

## 2015-12-19 NOTE — ED Notes (Signed)
MD at bedside. 

## 2016-01-18 ENCOUNTER — Encounter: Payer: BLUE CROSS/BLUE SHIELD | Admitting: Internal Medicine

## 2016-02-01 ENCOUNTER — Encounter: Payer: BLUE CROSS/BLUE SHIELD | Admitting: Gastroenterology

## 2018-03-29 DIAGNOSIS — R635 Abnormal weight gain: Secondary | ICD-10-CM | POA: Diagnosis not present

## 2018-03-29 DIAGNOSIS — R9389 Abnormal findings on diagnostic imaging of other specified body structures: Secondary | ICD-10-CM | POA: Diagnosis not present

## 2018-09-06 DIAGNOSIS — E7800 Pure hypercholesterolemia, unspecified: Secondary | ICD-10-CM | POA: Diagnosis not present

## 2018-09-06 DIAGNOSIS — E559 Vitamin D deficiency, unspecified: Secondary | ICD-10-CM | POA: Diagnosis not present

## 2018-09-06 DIAGNOSIS — E538 Deficiency of other specified B group vitamins: Secondary | ICD-10-CM | POA: Diagnosis not present

## 2018-09-06 DIAGNOSIS — R635 Abnormal weight gain: Secondary | ICD-10-CM | POA: Diagnosis not present

## 2018-09-06 DIAGNOSIS — R5383 Other fatigue: Secondary | ICD-10-CM | POA: Diagnosis not present

## 2018-09-06 DIAGNOSIS — Z79899 Other long term (current) drug therapy: Secondary | ICD-10-CM | POA: Diagnosis not present

## 2018-11-18 ENCOUNTER — Ambulatory Visit
Admission: RE | Admit: 2018-11-18 | Discharge: 2018-11-18 | Disposition: A | Discharge status: Home / Self Care | Payer: BLUE CROSS/BLUE SHIELD | Source: Ambulatory Visit | Attending: Internal Medicine | Admitting: Internal Medicine

## 2018-11-18 ENCOUNTER — Other Ambulatory Visit: Payer: Self-pay

## 2018-11-18 ENCOUNTER — Other Ambulatory Visit: Payer: Self-pay | Admitting: Internal Medicine

## 2018-11-18 DIAGNOSIS — R109 Unspecified abdominal pain: Secondary | ICD-10-CM | POA: Diagnosis not present

## 2018-11-19 DIAGNOSIS — R109 Unspecified abdominal pain: Secondary | ICD-10-CM | POA: Diagnosis not present

## 2018-11-19 DIAGNOSIS — M9909 Segmental and somatic dysfunction of abdomen and other regions: Secondary | ICD-10-CM | POA: Diagnosis not present

## 2018-12-27 DIAGNOSIS — Z0000 Encounter for general adult medical examination without abnormal findings: Secondary | ICD-10-CM | POA: Diagnosis not present

## 2018-12-27 DIAGNOSIS — Z79899 Other long term (current) drug therapy: Secondary | ICD-10-CM | POA: Diagnosis not present

## 2019-01-03 DIAGNOSIS — Z1331 Encounter for screening for depression: Secondary | ICD-10-CM | POA: Diagnosis not present

## 2019-01-03 DIAGNOSIS — R109 Unspecified abdominal pain: Secondary | ICD-10-CM | POA: Diagnosis not present

## 2019-01-03 DIAGNOSIS — E669 Obesity, unspecified: Secondary | ICD-10-CM | POA: Diagnosis not present

## 2019-01-03 DIAGNOSIS — Z0000 Encounter for general adult medical examination without abnormal findings: Secondary | ICD-10-CM | POA: Diagnosis not present

## 2019-04-04 DIAGNOSIS — E669 Obesity, unspecified: Secondary | ICD-10-CM | POA: Diagnosis not present

## 2019-04-04 DIAGNOSIS — N951 Menopausal and female climacteric states: Secondary | ICD-10-CM | POA: Diagnosis not present

## 2020-01-02 DIAGNOSIS — Z0000 Encounter for general adult medical examination without abnormal findings: Secondary | ICD-10-CM | POA: Diagnosis not present

## 2020-01-02 DIAGNOSIS — E669 Obesity, unspecified: Secondary | ICD-10-CM | POA: Diagnosis not present

## 2020-01-09 DIAGNOSIS — Z0000 Encounter for general adult medical examination without abnormal findings: Secondary | ICD-10-CM | POA: Diagnosis not present

## 2020-01-09 DIAGNOSIS — Z1389 Encounter for screening for other disorder: Secondary | ICD-10-CM | POA: Diagnosis not present

## 2020-01-09 DIAGNOSIS — M797 Fibromyalgia: Secondary | ICD-10-CM | POA: Diagnosis not present

## 2020-01-09 DIAGNOSIS — Z1331 Encounter for screening for depression: Secondary | ICD-10-CM | POA: Diagnosis not present

## 2020-02-18 DIAGNOSIS — J069 Acute upper respiratory infection, unspecified: Secondary | ICD-10-CM | POA: Diagnosis not present

## 2020-02-18 DIAGNOSIS — Z20822 Contact with and (suspected) exposure to covid-19: Secondary | ICD-10-CM | POA: Diagnosis not present

## 2020-03-18 DIAGNOSIS — R509 Fever, unspecified: Secondary | ICD-10-CM | POA: Diagnosis not present

## 2020-03-18 DIAGNOSIS — U071 COVID-19: Secondary | ICD-10-CM | POA: Diagnosis not present

## 2020-03-28 DIAGNOSIS — Z20822 Contact with and (suspected) exposure to covid-19: Secondary | ICD-10-CM | POA: Diagnosis not present

## 2020-09-26 ENCOUNTER — Other Ambulatory Visit: Payer: Self-pay | Admitting: Internal Medicine

## 2020-09-26 DIAGNOSIS — Z1231 Encounter for screening mammogram for malignant neoplasm of breast: Secondary | ICD-10-CM

## 2021-01-01 ENCOUNTER — Other Ambulatory Visit: Payer: Self-pay

## 2021-01-01 ENCOUNTER — Ambulatory Visit
Admission: EM | Admit: 2021-01-01 | Discharge: 2021-01-01 | Disposition: A | Discharge status: Home / Self Care | Payer: 59

## 2021-01-01 DIAGNOSIS — Z20822 Contact with and (suspected) exposure to covid-19: Secondary | ICD-10-CM

## 2021-01-01 NOTE — Discharge Instructions (Signed)
Covid results will return in about 24-48 hrs. If there is a positive result we will contact you. You can also check MyChart as well for your results.

## 2021-01-01 NOTE — ED Triage Notes (Signed)
Patient states on Sunday she began having sore throat, congestion, chills, and fatigue (had an exposure to Covid). Patient reports feeling better now. She is in need of a Covid and flu test for work.

## 2021-01-03 LAB — COVID-19, FLU A+B NAA
Influenza A, NAA: NOT DETECTED
Influenza B, NAA: NOT DETECTED
SARS-CoV-2, NAA: NOT DETECTED

## 2021-01-17 DIAGNOSIS — J101 Influenza due to other identified influenza virus with other respiratory manifestations: Secondary | ICD-10-CM | POA: Diagnosis not present

## 2021-01-17 DIAGNOSIS — J029 Acute pharyngitis, unspecified: Secondary | ICD-10-CM | POA: Diagnosis not present

## 2021-01-17 DIAGNOSIS — R059 Cough, unspecified: Secondary | ICD-10-CM | POA: Diagnosis not present

## 2021-07-01 IMAGING — CT CT ABD-PELV W/O CM
1 of 2 series · 14 of 32 positions shown, 19 images · non-contrast
Comparison: 11/15/2014

CLINICAL DATA: Left flank pain

EXAM:
CT ABDOMEN AND PELVIS WITHOUT CONTRAST
TECHNIQUE: Multidetector CT imaging of the abdomen and pelvis was performed
following the standard protocol without IV contrast.

[Series 2: abd/pelvis w/(date) · axial · 0.78mm/px · z∈[-445,-60]mm · 14 of 87 slices shown, 19 images]
[im 5/87  soft-tissue]
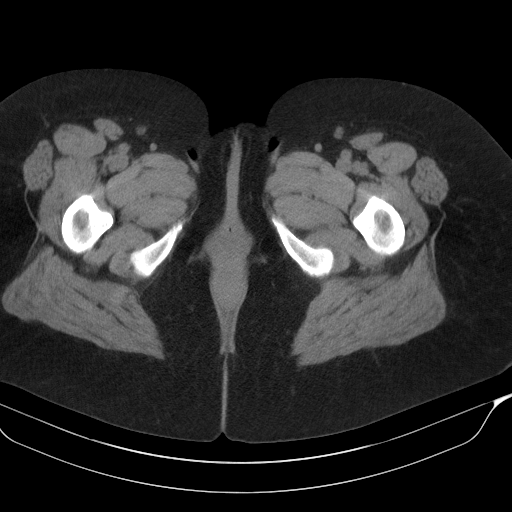
[im 5/87  bone]
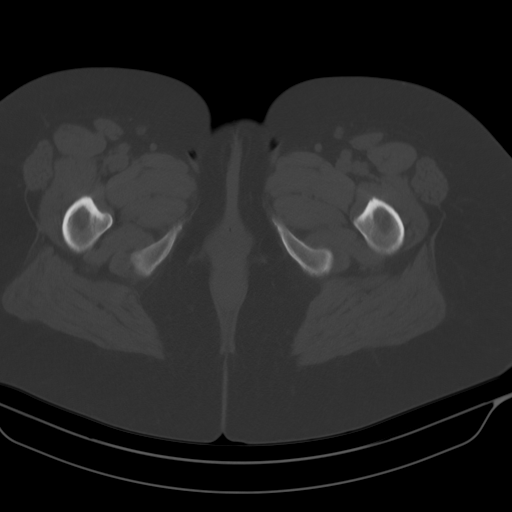
[im 14/87  soft-tissue]
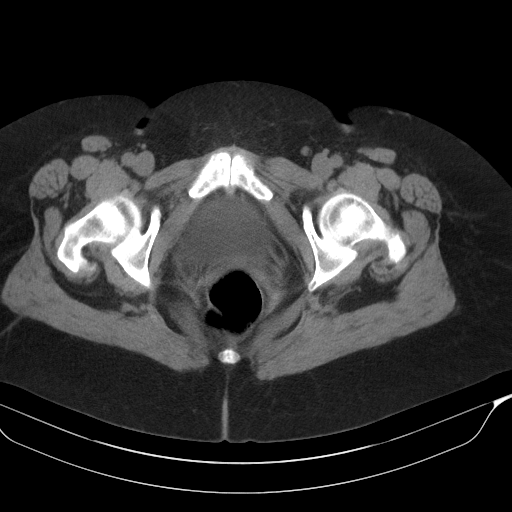
[im 19/87  soft-tissue]
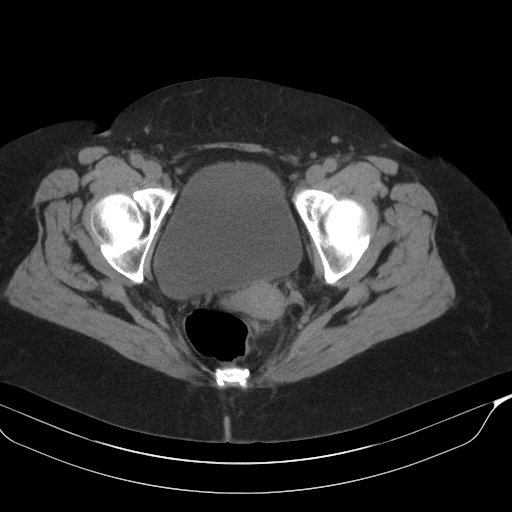
[im 23/87  soft-tissue]
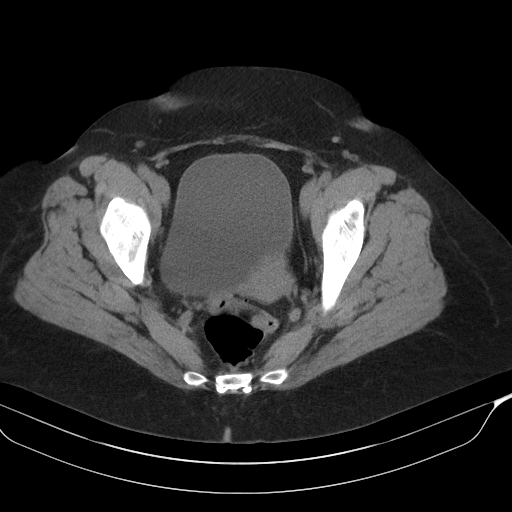
[im 32/87  soft-tissue]
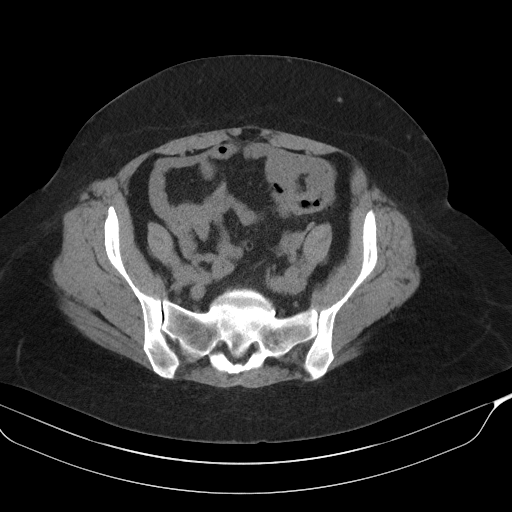
[im 37/87  soft-tissue]
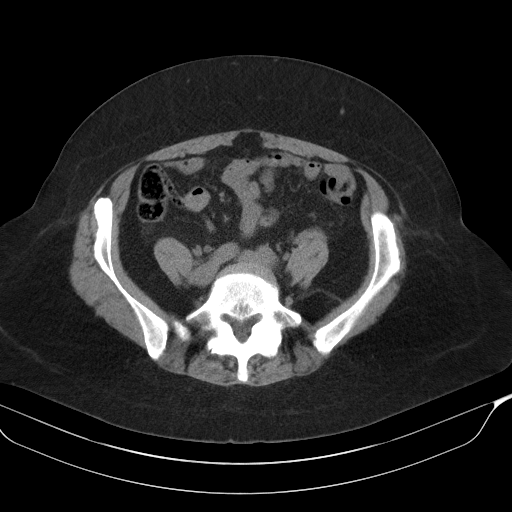
[im 46/87  soft-tissue]
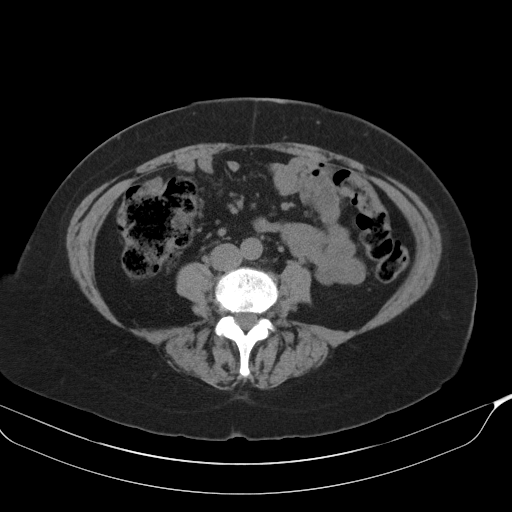
[im 50/87  soft-tissue]
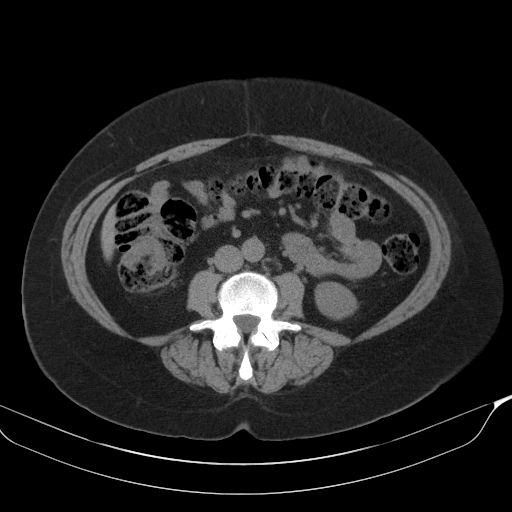
[im 55/87  soft-tissue]
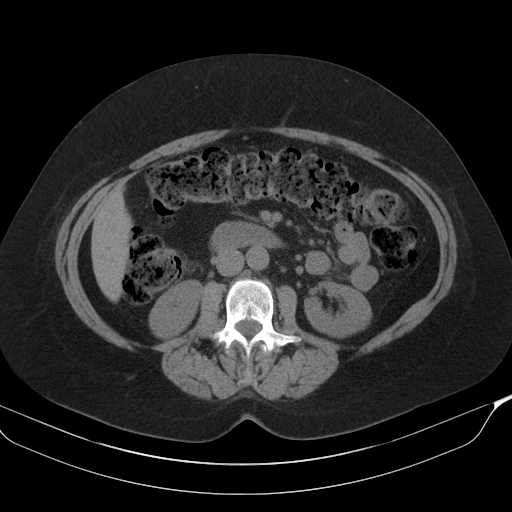
[im 55/87  bone]
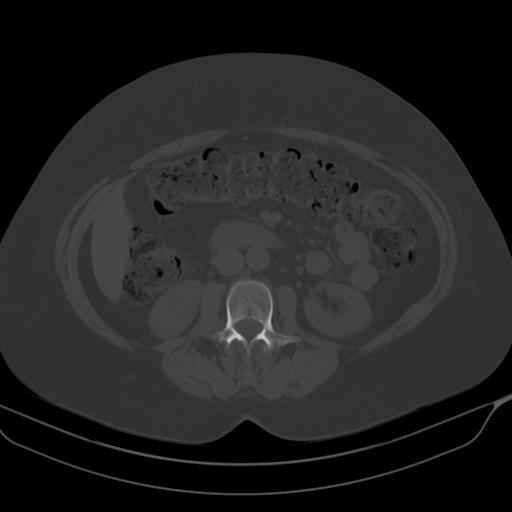
[im 64/87  soft-tissue]
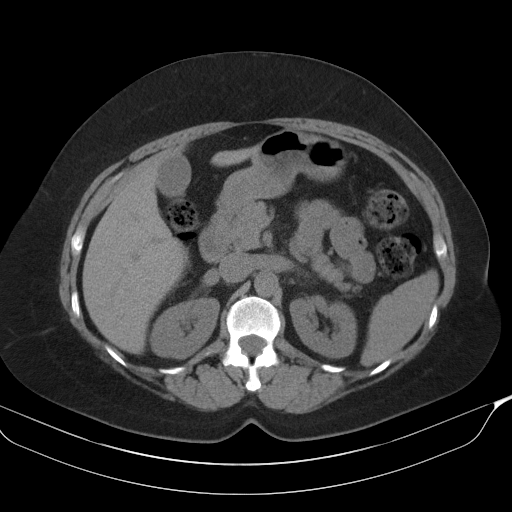
[im 68/87  soft-tissue]
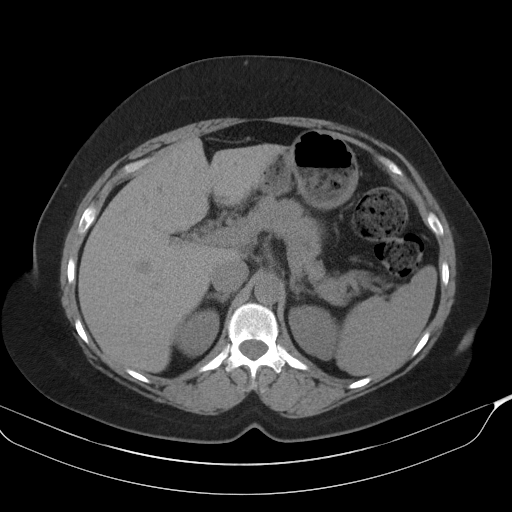
[im 68/87  lung]
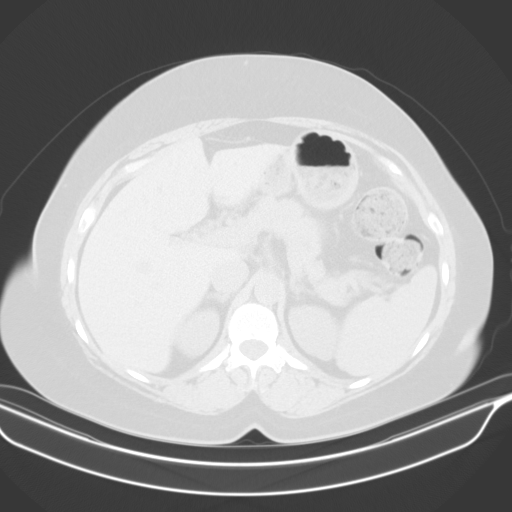
[im 73/87  soft-tissue]
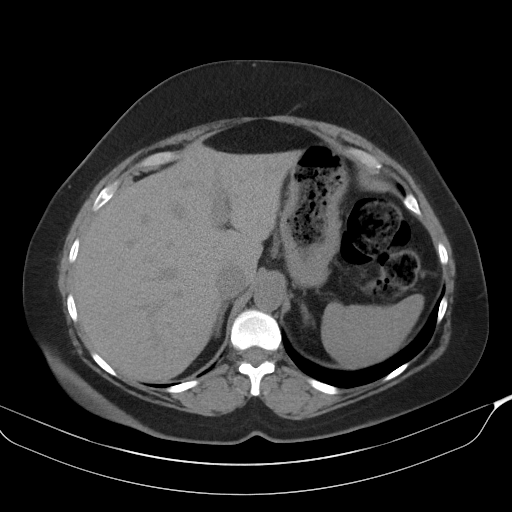
[im 73/87  lung]
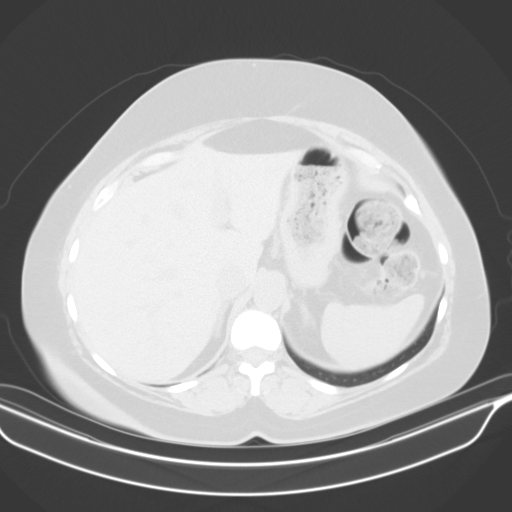
[im 77/87  lung]
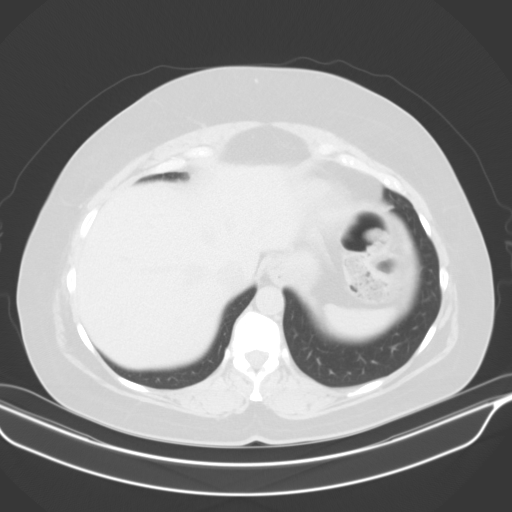
[im 82/87  soft-tissue]
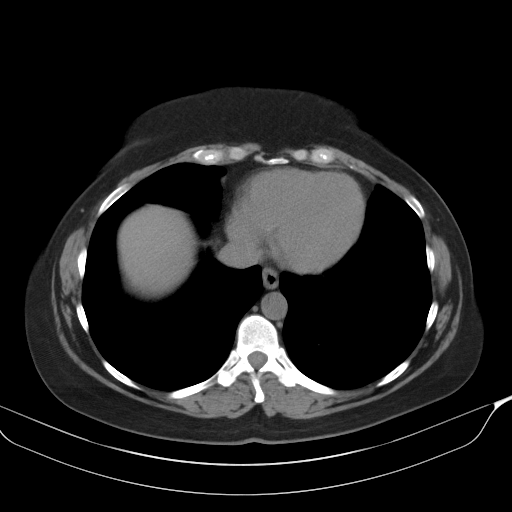
[im 82/87  lung]
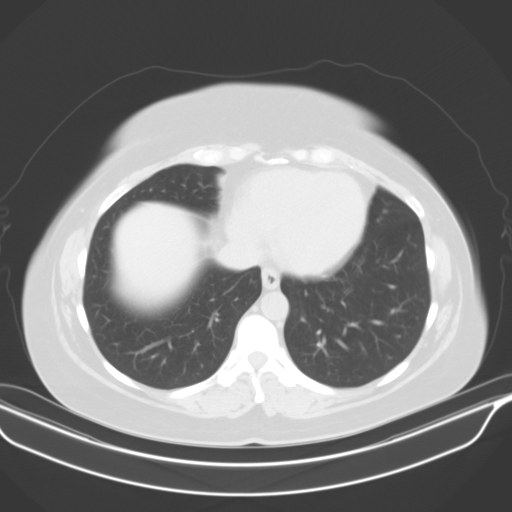

[14 of 32 positions shown; findings below may reference images not displayed]

FINDINGS: Lower chest: No acute abnormality.

Hepatobiliary: No focal liver abnormality is seen. No gallstones,
gallbladder wall thickening, or biliary dilatation.

Pancreas: Unremarkable. No pancreatic ductal dilatation or
surrounding inflammatory changes.

Spleen: Normal in size without focal abnormality.

Adrenals/Urinary Tract: Adrenal glands are unremarkable. Kidneys are
normal, without renal calculi, focal lesion, or hydronephrosis.
Ureters unremarkable. Bladder is unremarkable.

Stomach/Bowel: Stomach is within normal limits. Appendix appears
normal. Mild-to-moderate stool volume throughout the colon. No
evidence of bowel wall thickening, distention, or inflammatory
changes.

Vascular/Lymphatic: No significant vascular findings are present. No
enlarged abdominal or pelvic lymph nodes.

Reproductive: Uterus and bilateral adnexa are unremarkable.

Other: Small fat containing umbilical hernia.  No ascites.

Musculoskeletal: No acute or significant osseous findings.
IMPRESSION: 1. No acute abdominopelvic findings. Specifically, no evidence of
obstructive uropathy.
2. Mild to moderate colonic stool burden.
